# Patient Record
Sex: Male | Born: 1990 | Race: White | Hispanic: No | Marital: Married
Health system: Southern US, Community
[De-identification: ages and names within clinical notes are randomized; demographics above are authoritative.]

## PROBLEM LIST (undated history)

## (undated) HISTORY — PX: MOUTH SURGERY: SHX715

---

## 2009-02-24 DEATH — deceased

## 2012-11-20 ENCOUNTER — Ambulatory Visit: Payer: Self-pay | Admitting: Internal Medicine

## 2014-01-21 ENCOUNTER — Encounter (HOSPITAL_COMMUNITY): Payer: Self-pay | Admitting: Emergency Medicine

## 2014-01-21 ENCOUNTER — Emergency Department (HOSPITAL_COMMUNITY)
Admission: EM | Admit: 2014-01-21 | Discharge: 2014-01-21 | Disposition: A | Discharge status: Home / Self Care | Payer: BC Managed Care – PPO | Attending: Emergency Medicine | Admitting: Emergency Medicine

## 2014-01-21 ENCOUNTER — Emergency Department (HOSPITAL_COMMUNITY): Payer: BC Managed Care – PPO

## 2014-01-21 DIAGNOSIS — M549 Dorsalgia, unspecified: Secondary | ICD-10-CM

## 2014-01-21 DIAGNOSIS — M5432 Sciatica, left side: Secondary | ICD-10-CM

## 2014-01-21 DIAGNOSIS — X58XXXA Exposure to other specified factors, initial encounter: Secondary | ICD-10-CM | POA: Diagnosis not present

## 2014-01-21 DIAGNOSIS — S3992XA Unspecified injury of lower back, initial encounter: Secondary | ICD-10-CM | POA: Diagnosis present

## 2014-01-21 DIAGNOSIS — Y9289 Other specified places as the place of occurrence of the external cause: Secondary | ICD-10-CM | POA: Insufficient documentation

## 2014-01-21 DIAGNOSIS — Y9389 Activity, other specified: Secondary | ICD-10-CM | POA: Diagnosis not present

## 2014-01-21 MED ORDER — CYCLOBENZAPRINE HCL 10 MG PO TABS
10.0000 mg | ORAL_TABLET | Freq: Once | ORAL | Status: AC
  Administered 2014-01-21: 10 mg via ORAL
  Filled 2014-01-21: qty 1

## 2014-01-21 MED ORDER — KETOROLAC TROMETHAMINE 30 MG/ML IJ SOLN
30.0000 mg | Freq: Once | INTRAMUSCULAR | Status: AC
  Administered 2014-01-21: 30 mg via INTRAVENOUS
  Filled 2014-01-21: qty 1

## 2014-01-21 MED ORDER — CYCLOBENZAPRINE HCL 10 MG PO TABS: 10.0000 mg | ORAL_TABLET | Freq: Two times a day (BID) | ORAL | Status: DC | PRN

## 2014-01-21 MED ORDER — HYDROCODONE-ACETAMINOPHEN 5-325 MG PO TABS: 1.0000 | ORAL_TABLET | ORAL | Status: DC | PRN

## 2014-01-21 MED ORDER — PREDNISONE 20 MG PO TABS: 40.0000 mg | ORAL_TABLET | Freq: Every day | ORAL | Status: DC

## 2014-01-21 NOTE — ED Notes (Signed)
Pt returned from xray

## 2014-01-21 NOTE — Discharge Instructions (Signed)
Take prednisone as directed until gone. Take Vicodin as needed for pain. Take flexeril as needed for muscle spasm. You may take these medications together. Refer to attached documents for more information. Follow up with Dr. Shon BatonBrooks for further evaluation.

## 2014-01-21 NOTE — ED Provider Notes (Signed)
Medical screening examination/treatment/procedure(s) were performed by non-physician practitioner and as supervising physician I was immediately available for consultation/collaboration.   EKG Interpretation None       Doug SouSam Nayali Talerico, MD 01/21/14 [phone]

## 2014-01-21 NOTE — ED Notes (Signed)
Patient states was working out last night and he felt a pop in L side of back.  Patient states lower back pain with radiation to L leg.

## 2014-01-21 NOTE — ED Provider Notes (Signed)
CSN: [id number]     Arrival date & time 01/21/14  0829 History  This chart was scribed for non-physician practitioner, Emilia BeckKaitlyn Verne Lanuza, PA-C working with Doug SouSam Jacubowitz, MD by Greggory StallionKayla Andersen, ED scribe. This patient was seen in room TR07C/TR07C and the patient's care was started at 9:09 AM.   Chief Complaint  Patient presents with  . Back Pain   The history is provided by the patient. No language interpreter was used.   HPI Comments: Cody Perkins is a 23 y.o. male who presents to the Emergency Department complaining of left lower back pain that started last night. Pain radiates into his left leg. States he was working out and felt a pop in his back. Bearing weight worsens the pain. He has taken ibuprofen and used a heating pad with no relief. Denies numbness or tingling in legs.   History reviewed. No pertinent past medical history. Past Surgical History  Procedure Laterality Date  . Mouth surgery     No family history on file. History  Substance Use Topics  . Smoking status: Not on file  . Smokeless tobacco: Not on file  . Alcohol Use: No    Review of Systems  Musculoskeletal: Positive for back pain.  Neurological: Negative for numbness.  All other systems reviewed and are negative.  Allergies  Review of patient's allergies indicates no known allergies.  Home Medications   Prior to Admission medications   Not on File   BP 133/71  Pulse 83  Temp(Src) 97.1 F (36.2 C) (Oral)  Resp 18  SpO2 99%  Physical Exam  Nursing note and vitals reviewed. Constitutional: He is oriented to person, place, and time. He appears well-developed and well-nourished. No distress.  HENT:  Head: Normocephalic and atraumatic.  Eyes: Conjunctivae and EOM are normal.  Neck: Neck supple. No tracheal deviation present.  Cardiovascular: Normal rate.   Pulmonary/Chest: Effort normal. No respiratory distress.  Musculoskeletal: Normal range of motion.  No midline spine tenderness to palpation.  No paraspinal tenderness to palpation. Pain illicited with movement of left leg.   Neurological: He is alert and oriented to person, place, and time.  Lower extremity strength and sensation equal and intact.   Skin: Skin is warm and dry.  Psychiatric: He has a normal mood and affect. His behavior is normal.    ED Course  Procedures (including critical care time)  DIAGNOSTIC STUDIES: Oxygen Saturation is 99% on RA, normal by my interpretation.    COORDINATION OF CARE: 9:12 AM-Discussed treatment plan which includes xray with pt at bedside and pt agreed to plan. Advised pt that if xray is negative, he will be discharged home with prednisone, pain medication and a muscle relaxer.   Labs Review Labs Reviewed - No data to display  Imaging Review Dg Lumbar Spine Complete  01/21/2014   CLINICAL DATA:  Low back pain after physical exertion  EXAM: LUMBAR SPINE - COMPLETE 4+ VIEW  COMPARISON:  None.  FINDINGS: Frontal, lateral, spot lumbosacral lateral, and bilateral oblique views were obtained. There are 5 non-rib-bearing lumbar type vertebral bodies. There is thoracolumbar dextroscoliosis. There is no fracture or spondylolisthesis. There is slight disc space narrowing at L4-5. Other disc spaces appear normal. There is no appreciable facet arthropathy.  IMPRESSION: Slight disc space narrowing L4-5. Mild scoliosis. No fracture or spondylolisthesis.   Electronically Signed   By: Bretta BangWilliam  Woodruff M.D.   On: 01/21/2014 09:42     EKG Interpretation None      MDM   Final  diagnoses:  Back pain  Sciatica, left    Patient's lumbar xray shows slight disc space narrowing at L4-L5. Patient likely has pain from nerve inflammation. Patient will have prednisone, Vicodin, and flexeril for pain. No bladder/bowel incontinence or saddle paresthesias. No other injury.   I personally performed the services described in this documentation, which was scribed in my presence. The recorded information has been  reviewed and is accurate.  [name], PA-C 01/21/14 1124

## 2016-12-27 DIAGNOSIS — Z0000 Encounter for general adult medical examination without abnormal findings: Secondary | ICD-10-CM | POA: Diagnosis not present

## 2017-01-03 DIAGNOSIS — Z23 Encounter for immunization: Secondary | ICD-10-CM | POA: Diagnosis not present

## 2017-01-03 DIAGNOSIS — G43009 Migraine without aura, not intractable, without status migrainosus: Secondary | ICD-10-CM | POA: Diagnosis not present

## 2017-01-03 DIAGNOSIS — M545 Low back pain: Secondary | ICD-10-CM | POA: Diagnosis not present

## 2017-01-03 DIAGNOSIS — Z1389 Encounter for screening for other disorder: Secondary | ICD-10-CM | POA: Diagnosis not present

## 2017-01-03 DIAGNOSIS — Z0000 Encounter for general adult medical examination without abnormal findings: Secondary | ICD-10-CM | POA: Diagnosis not present

## 2017-01-03 DIAGNOSIS — R590 Localized enlarged lymph nodes: Secondary | ICD-10-CM | POA: Diagnosis not present

## 2017-10-03 DIAGNOSIS — J309 Allergic rhinitis, unspecified: Secondary | ICD-10-CM | POA: Diagnosis not present

## 2017-10-03 DIAGNOSIS — R05 Cough: Secondary | ICD-10-CM | POA: Diagnosis not present

## 2018-01-02 DIAGNOSIS — Z0000 Encounter for general adult medical examination without abnormal findings: Secondary | ICD-10-CM | POA: Diagnosis not present

## 2018-01-02 DIAGNOSIS — R82998 Other abnormal findings in urine: Secondary | ICD-10-CM | POA: Diagnosis not present

## 2018-01-09 DIAGNOSIS — Z1389 Encounter for screening for other disorder: Secondary | ICD-10-CM | POA: Diagnosis not present

## 2018-01-09 DIAGNOSIS — Z0000 Encounter for general adult medical examination without abnormal findings: Secondary | ICD-10-CM | POA: Diagnosis not present

## 2018-01-09 DIAGNOSIS — J309 Allergic rhinitis, unspecified: Secondary | ICD-10-CM | POA: Diagnosis not present

## 2018-01-09 DIAGNOSIS — H8110 Benign paroxysmal vertigo, unspecified ear: Secondary | ICD-10-CM | POA: Diagnosis not present

## 2018-01-09 DIAGNOSIS — M545 Low back pain: Secondary | ICD-10-CM | POA: Diagnosis not present

## 2019-06-16 ENCOUNTER — Ambulatory Visit: Payer: Self-pay

## 2020-06-13 ENCOUNTER — Other Ambulatory Visit: Payer: Self-pay

## 2020-06-13 ENCOUNTER — Emergency Department
Admission: EM | Admit: 2020-06-13 | Discharge: 2020-06-13 | Disposition: A | Discharge status: Home / Self Care | Payer: 59 | Attending: Emergency Medicine | Admitting: Emergency Medicine

## 2020-06-13 ENCOUNTER — Emergency Department: Payer: 59

## 2020-06-13 DIAGNOSIS — H6501 Acute serous otitis media, right ear: Secondary | ICD-10-CM | POA: Insufficient documentation

## 2020-06-13 DIAGNOSIS — H9201 Otalgia, right ear: Secondary | ICD-10-CM | POA: Diagnosis present

## 2020-06-13 DIAGNOSIS — G43709 Chronic migraine without aura, not intractable, without status migrainosus: Secondary | ICD-10-CM | POA: Diagnosis not present

## 2020-06-13 DIAGNOSIS — R42 Dizziness and giddiness: Secondary | ICD-10-CM

## 2020-06-13 MED ORDER — MECLIZINE HCL 25 MG PO TABS
25.0000 mg | ORAL_TABLET | Freq: Once | ORAL | Status: AC
  Administered 2020-06-13: 25 mg via ORAL
  Filled 2020-06-13: qty 1

## 2020-06-13 MED ORDER — MECLIZINE HCL 12.5 MG PO TABS: ORAL_TABLET | ORAL | 0 refills | Status: AC

## 2020-06-13 MED ORDER — METOCLOPRAMIDE HCL 10 MG PO TABS
10.0000 mg | ORAL_TABLET | Freq: Once | ORAL | Status: AC
Start: 2020-06-13 — End: 2020-06-13
  Administered 2020-06-13: 10 mg via ORAL
  Filled 2020-06-13: qty 1

## 2020-06-13 MED ORDER — KETOROLAC TROMETHAMINE 30 MG/ML IJ SOLN
30.0000 mg | Freq: Once | INTRAMUSCULAR | Status: AC
  Administered 2020-06-13: 30 mg via INTRAMUSCULAR
  Filled 2020-06-13: qty 1

## 2020-06-13 MED ORDER — METOCLOPRAMIDE HCL 5 MG PO TABS
ORAL_TABLET | ORAL | 0 refills | Status: AC
Start: 2020-06-13 — End: ?

## 2020-06-13 MED ORDER — KETOROLAC TROMETHAMINE 10 MG PO TABS: 10.0000 mg | ORAL_TABLET | Freq: Three times a day (TID) | ORAL | 0 refills | Status: AC

## 2020-06-13 NOTE — ED Triage Notes (Signed)
Pt states he feels like he has a lot of pressure in his right ear since this morning- pt has a hx of vertigo- pt states whenever he moves his head too fast he feels nausea

## 2020-06-13 NOTE — Discharge Instructions (Addendum)
Your exam is normal and reassuring at this time he may have a small serous effusion in the ears.  You have a normal head CT at this time, no serious intracranial process found.  We will manage your headache with a combination of over-the-counter and prescription medications.  You may take Tylenol 1000 mg in addition to the meds for additional headache pain relief.  Ibuprofen 800 mg is available for headache management as well.  Consider following up with a headache multicentered in [place] as discussed.

## 2020-06-13 NOTE — ED Provider Notes (Signed)
[facility] Emergency Department Provider Note ____________________________________________  Time seen: 1349  I have reviewed the triage vital signs and the nursing notes.  HISTORY  Chief Complaint  Otalgia  HPI Cody Perkins is a 30 y.o. male presents to the ED accompanied by his wife, for evaluation of right ear pressure and dullness.  He also reports some history of vertigo that was aggravated today, with meds, accompanying nausea and vomiting.  He reports the nausea is increased when he moves his head too quickly.  Patient also has a history of migraines, and reports onset of headache after the nausea and vomiting episode.  He denies any hearing loss but does report some drowning tone in the right ear primarily.  He denies any recent head trauma fevers, chills, sweats, chest pain, or shortness of breath.  Patient has been evaluated in the past with migraine history, has been prescribed rizatriptan, which he takes as needed.  He is in the past prescribed meclizine, but denies any current prescription orders.  He denies any other complaints at this time.   History reviewed. No pertinent past medical history.  There are no problems to display for this patient.   Past Surgical History:  Procedure Laterality Date  . MOUTH SURGERY      Prior to Admission medications   Medication Sig Start Date End Date Taking? Authorizing Provider  ketorolac (TORADOL) 10 MG tablet Take 1 tablet (10 mg total) by mouth every 8 (eight) hours. 06/13/20  Yes Menshew, Charlesetta Ivory, PA-C  meclizine (ANTIVERT) 12.5 MG tablet Take 1-2 tabs TID prn vertigo 06/13/20  Yes Menshew, Charlesetta Ivory, PA-C  metoCLOPramide (REGLAN) 5 MG tablet Take 1-2 tabs TID prn N/V 06/13/20  Yes Menshew, Charlesetta Ivory, PA-C    Allergies Patient has no known allergies.  No family history on file.  Social History Social History   Substance Use Topics  . Alcohol use: No  . Drug use: No    Review of  Systems  Constitutional: Negative for fever. Eyes: Negative for visual changes. ENT: Negative for sore throat.  Reports tinnitus, and decreased hearing on the right. Cardiovascular: Negative for chest pain. Respiratory: Negative for shortness of breath. Gastrointestinal: Negative for abdominal pain, vomiting and diarrhea. Genitourinary: Negative for dysuria. Musculoskeletal: Negative for back pain. Skin: Negative for rash. Neurological: Positive for intense headaches.  Denies focal weakness or numbness. ____________________________________________  PHYSICAL EXAM:  VITAL SIGNS: ED Triage Vitals  Enc Vitals Group     BP 06/13/20 1241 (!) 144/96     Pulse Rate 06/13/20 1241 74     Resp 06/13/20 1241 16     Temp 06/13/20 1243 (!) 97.4 F (36.3 C)     Temp Source 06/13/20 1243 Oral     SpO2 06/13/20 1241 99 %     Weight 06/13/20 1243 185 lb (83.9 kg)     Height 06/13/20 1243 5\' 10"  (1.778 m)     Head Circumference --      Peak Flow --      Pain Score 06/13/20 1243 7     Pain Loc --      Pain Edu? --      Excl. in GC? --     Constitutional: Alert and oriented. Well appearing and in no distress. Head: Normocephalic and atraumatic. Eyes: Conjunctivae are normal. PERRL. Normal extraocular movements and fundi bilaterally. Ears: Canals clear. TMs intact bilaterally.  No bulging, injection, or purulent effusion is appreciated.  Patient likely has  a small serous effusion noted bilaterally. Nose: No congestion/rhinorrhea/epistaxis. Mouth/Throat: Mucous membranes are moist. Neck: Supple.  Normal range of motion without crepitus. Cardiovascular: Normal rate, regular rhythm. Normal distal pulses. Respiratory: Normal respiratory effort. No wheezes/rales/rhonchi. Gastrointestinal: Soft and nontender. No distention. Musculoskeletal: Nontender with normal range of motion in all extremities.  Neurologic: Cranial nerves II to XII grossly intact.  Normal gait without ataxia. Normal speech and  language. No gross focal neurologic deficits are appreciated. Skin:  Skin is warm, dry and intact. No rash noted. Psychiatric: Mood and affect are normal. Patient exhibits appropriate insight and judgment. ____________________________________________  RADIOLOGY  CT Head w/o CM  IMPRESSION: No focal acute intracranial abnormality identified. ____________________________________________  PROCEDURES  Toradol 30 mg IM Reglan 10 mg PO Meclizine 25 mg PO  Procedures ____________________________________________  INITIAL IMPRESSION / ASSESSMENT AND PLAN / ED COURSE  DDX: Meniere's Disease, BPV, migraine, AOM, TM rupture  Patient with a history of chronic migraines and BPP vertigo, presents to the ED for evaluation of nausea and vomiting preceded by vertigo, and followed by onset of a typical migraine headache.  Patient is wife are concerned for underlying etiologies because he has never apparently had vertigo and migraine headache at the same time.  Patient has been stable during his course in the ED.  His CT scan is normal and reassuring at this time.  He is reporting complete resolution of his headache and nausea at the time of this disposition, following ED medication administration.  Patient is at this time safe for discharge home and is reassured by his overall work-up.  He will follow-up with the headache clinic in [place] and or [facility] for ongoing symptom management.  Prescriptions for meclizine, Toradol, and Reglan are provided for his symptom relief.  He will additionally take over-the-counter Benadryl, Tylenol, and Motrin once the ketorolac is completed.  Return precautions have been discussed.   Cody Perkins was evaluated in Emergency Department on 06/13/2020 for the symptoms described in the history of present illness. He was evaluated in the context of the global COVID-19 pandemic, which necessitated consideration that the patient might be at risk for infection with the  SARS-CoV-2 virus that causes COVID-19. Institutional protocols and algorithms that pertain to the evaluation of patients at risk for COVID-19 are in a state of rapid change based on information released by regulatory bodies including the CDC and federal and state organizations. These policies and algorithms were followed during the patient's care in the ED. ___________________________________________  FINAL CLINICAL IMPRESSION(S) / ED DIAGNOSES  Final diagnoses:  Non-recurrent acute serous otitis media of right ear  Vertigo  Chronic migraine without aura without status migrainosus, not intractable      Karmen Stabs, Charlesetta Ivory, PA-C 06/13/20 1957    Chesley Noon, MD 06/16/20 2321

## 2022-08-19 IMAGING — CT CT HEAD W/O CM
3 series · 16 of 47 positions shown, 19 images · non-contrast
Comparison: None.

CLINICAL DATA: Dizziness and headache.

EXAM:
CT HEAD WITHOUT CONTRAST
TECHNIQUE: Contiguous axial images were obtained from the base of the skull
through the vertex without intravenous contrast.

[Series 2: head wo · axial · 0.40mm/px · z∈[+473,+598]mm · 10 of 30 slices shown, 13 images]
[im 3/30  brain]
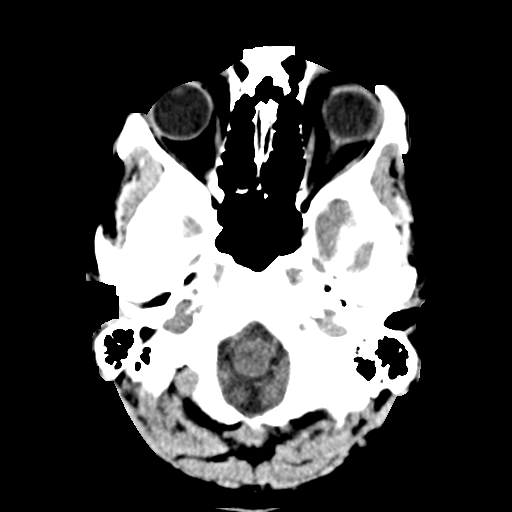
[im 3/30  bone]
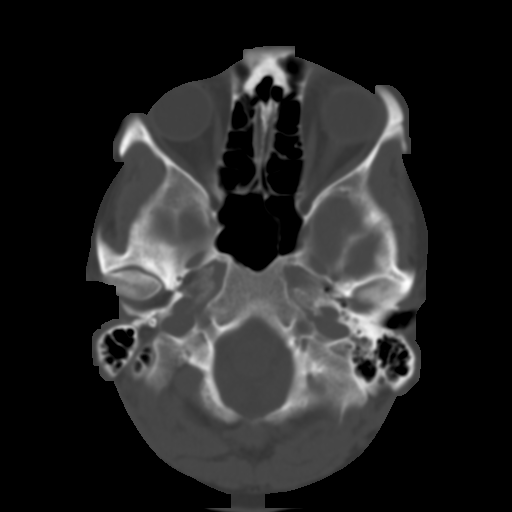
[im 6/30  brain]
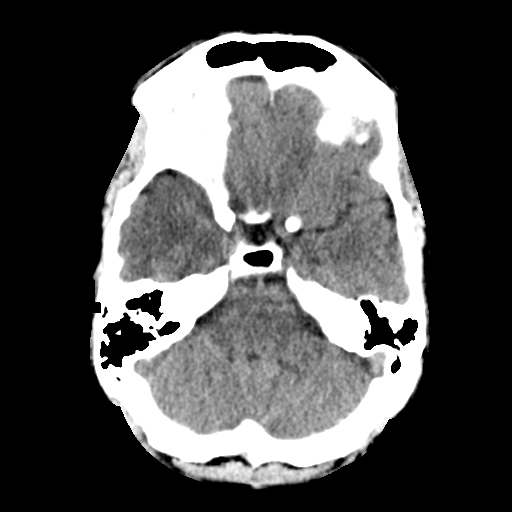
[im 9/30  brain]
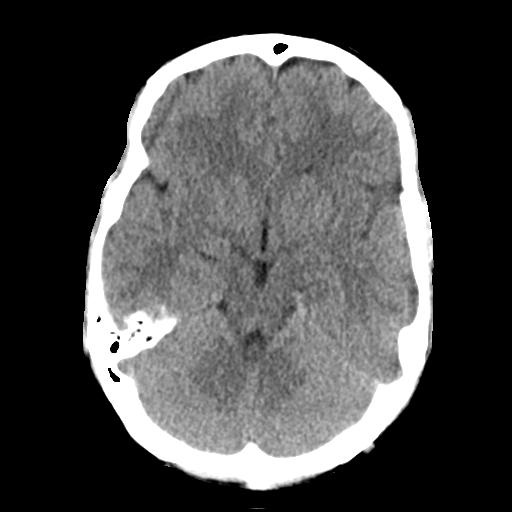
[im 11/30  brain]
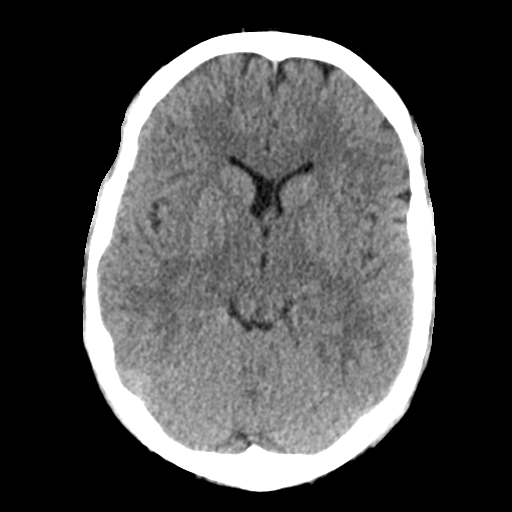
[im 14/30  brain]
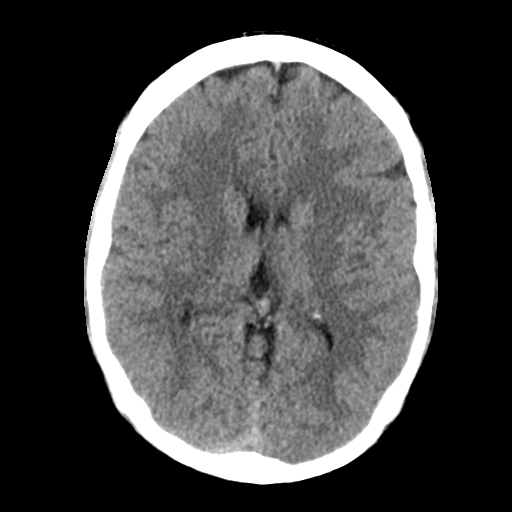
[im 14/30  bone]
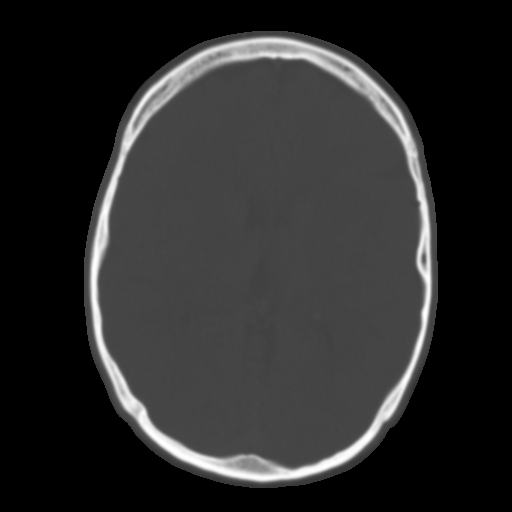
[im 17/30  brain]
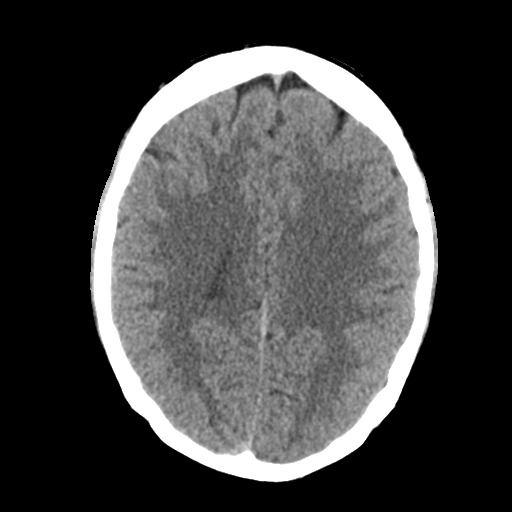
[im 20/30  brain]
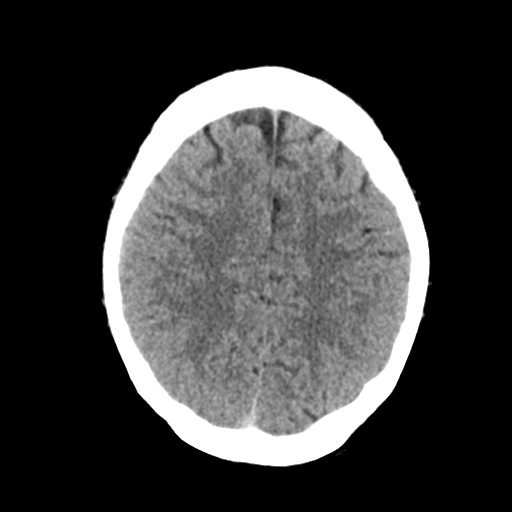
[im 23/30  brain]
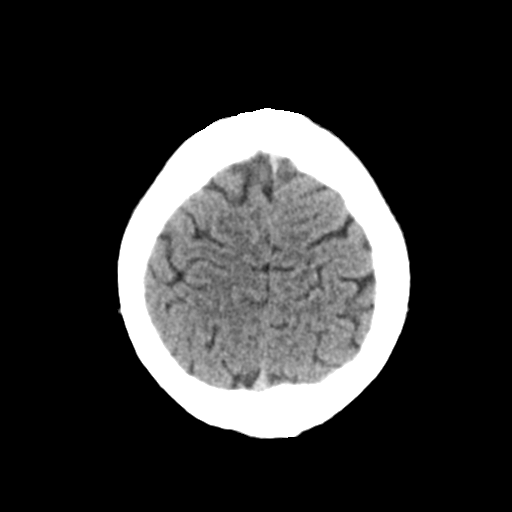
[im 25/30  brain]
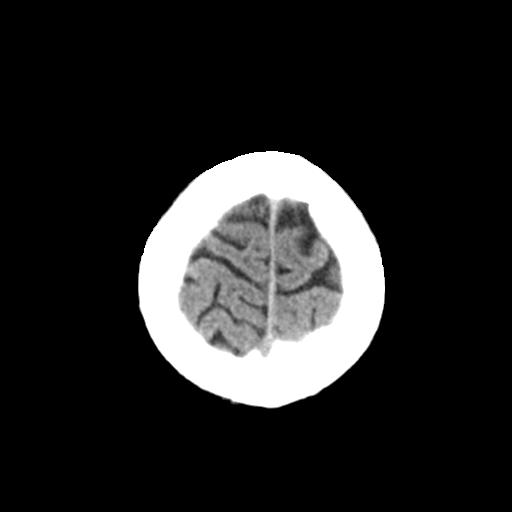
[im 25/30  bone]
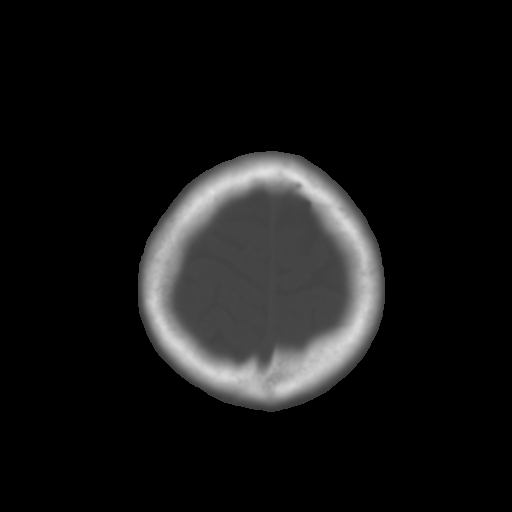
[im 28/30  brain]
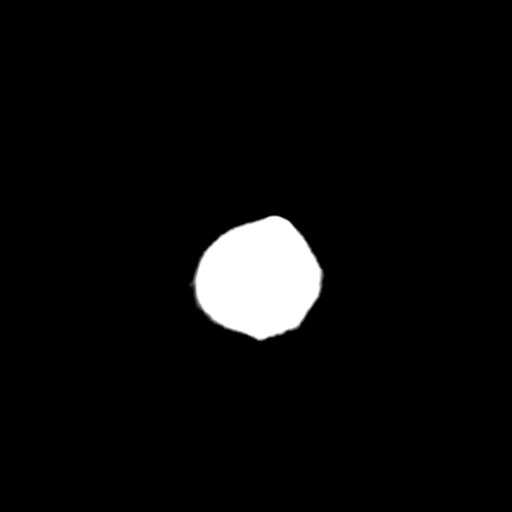

[Series 4: coronal soft tissue · coronal · 0.31mm/px · 3 of 69 slices shown]
[im 23/69  brain]
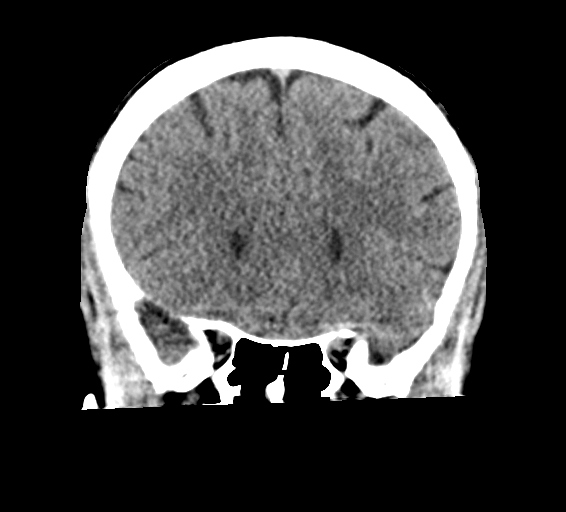
[im 31/69  brain]
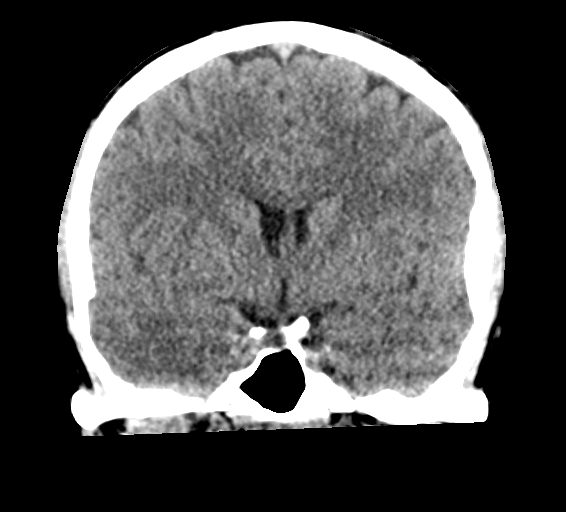
[im 38/69  brain]
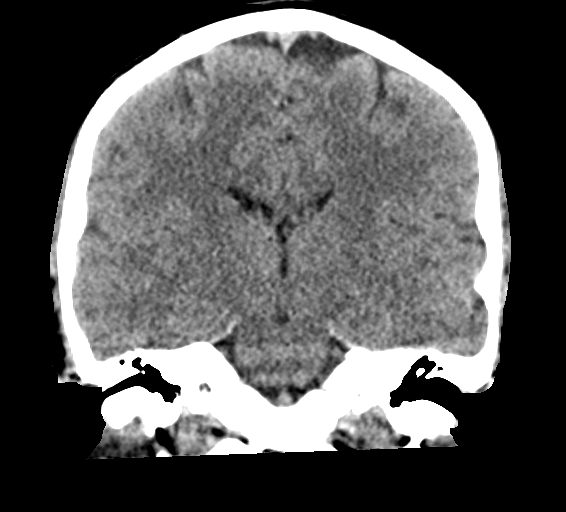

[Series 5: sagittal soft tissue · sagittal · 0.31mm/px · 3 of 59 slices shown]
[im 20/59  brain]
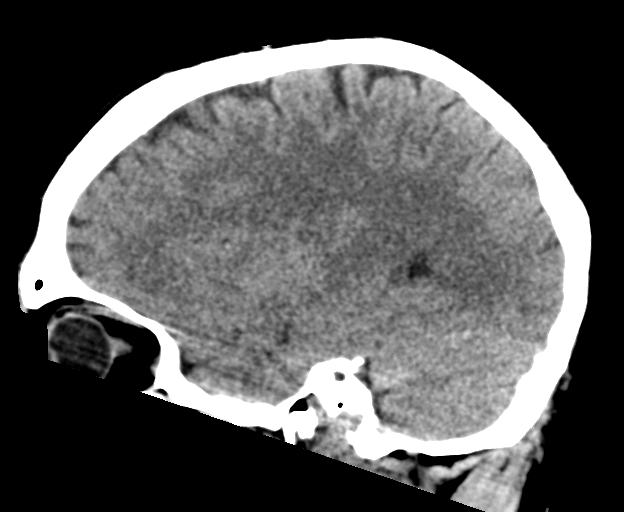
[im 30/59  brain]
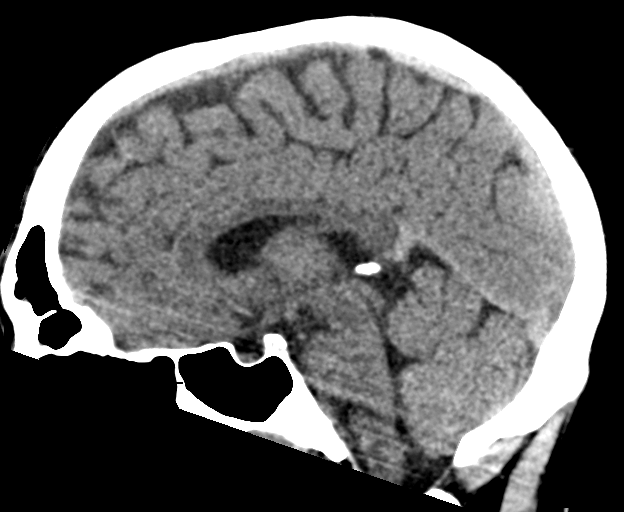
[im 39/59  brain]
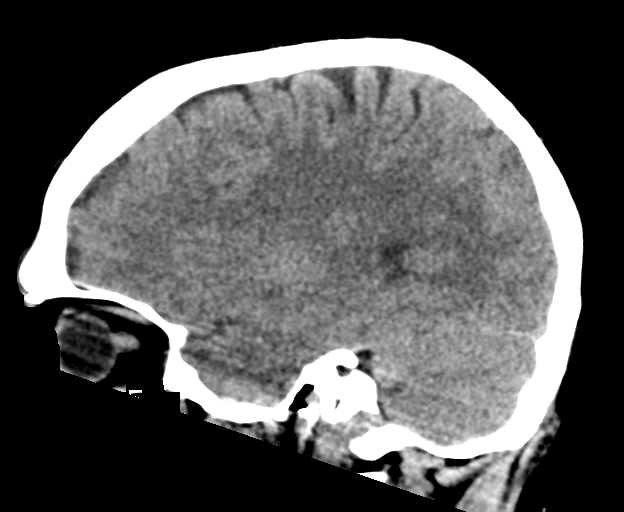

[16 of 47 positions shown; findings below may reference images not displayed]

FINDINGS: Brain: No evidence of acute infarction, hemorrhage, hydrocephalus,
extra-axial collection or mass lesion/mass effect.

Vascular: No hyperdense vessel is noted.

Skull: Normal. Negative for fracture or focal lesion.

Sinuses/Orbits: No acute finding.

Other: None.
IMPRESSION: No focal acute intracranial abnormality identified.
